# Patient Record
Sex: Female | Born: 1975 | Race: White | Hispanic: No
Health system: Southern US, Community
[De-identification: ages and names within clinical notes are randomized; demographics above are authoritative.]

---

## 1998-03-05 ENCOUNTER — Other Ambulatory Visit: Admission: RE | Admit: 1998-03-05 | Discharge: 1998-03-05 | Payer: Self-pay | Admitting: Obstetrics and Gynecology

## 1998-03-20 ENCOUNTER — Inpatient Hospital Stay (HOSPITAL_COMMUNITY): Admission: AD | Admit: 1998-03-20 | Discharge: 1998-03-20 | Payer: Self-pay | Admitting: Obstetrics & Gynecology

## 1998-06-09 ENCOUNTER — Inpatient Hospital Stay (HOSPITAL_COMMUNITY): Admission: AD | Admit: 1998-06-09 | Discharge: 1998-06-09 | Payer: Self-pay | Admitting: Obstetrics and Gynecology

## 1998-06-10 ENCOUNTER — Inpatient Hospital Stay (HOSPITAL_COMMUNITY): Admission: AD | Admit: 1998-06-10 | Discharge: 1998-06-13 | Payer: Self-pay | Admitting: Obstetrics and Gynecology

## 1999-12-06 ENCOUNTER — Emergency Department (HOSPITAL_COMMUNITY): Admission: EM | Admit: 1999-12-06 | Discharge: 1999-12-06 | Payer: Self-pay | Admitting: Emergency Medicine

## 2000-03-08 ENCOUNTER — Inpatient Hospital Stay (HOSPITAL_COMMUNITY): Admission: AD | Admit: 2000-03-08 | Discharge: 2000-03-08 | Payer: Self-pay | Admitting: Obstetrics and Gynecology

## 2000-06-06 ENCOUNTER — Inpatient Hospital Stay (HOSPITAL_COMMUNITY): Admission: AD | Admit: 2000-06-06 | Discharge: 2000-06-06 | Payer: Self-pay | Admitting: Obstetrics and Gynecology

## 2000-07-21 ENCOUNTER — Ambulatory Visit (HOSPITAL_COMMUNITY): Admission: AD | Admit: 2000-07-21 | Discharge: 2000-07-21 | Payer: Self-pay | Admitting: Obstetrics and Gynecology

## 2000-07-28 ENCOUNTER — Encounter: Payer: Self-pay | Admitting: Obstetrics and Gynecology

## 2000-07-28 ENCOUNTER — Inpatient Hospital Stay (HOSPITAL_COMMUNITY): Admission: AD | Admit: 2000-07-28 | Discharge: 2000-07-28 | Payer: Self-pay | Admitting: Obstetrics and Gynecology

## 2000-08-11 ENCOUNTER — Encounter: Payer: Self-pay | Admitting: Obstetrics and Gynecology

## 2000-08-11 ENCOUNTER — Ambulatory Visit (HOSPITAL_COMMUNITY): Admission: RE | Admit: 2000-08-11 | Discharge: 2000-08-11 | Payer: Self-pay | Admitting: Obstetrics and Gynecology

## 2000-10-11 ENCOUNTER — Inpatient Hospital Stay (HOSPITAL_COMMUNITY): Admission: AD | Admit: 2000-10-11 | Discharge: 2000-10-12 | Payer: Self-pay | Admitting: Obstetrics and Gynecology

## 2002-05-23 ENCOUNTER — Other Ambulatory Visit: Admission: RE | Admit: 2002-05-23 | Discharge: 2002-05-23 | Payer: Self-pay | Admitting: Obstetrics and Gynecology

## 2002-09-11 ENCOUNTER — Encounter: Payer: Self-pay | Admitting: Obstetrics and Gynecology

## 2002-09-11 ENCOUNTER — Ambulatory Visit (HOSPITAL_COMMUNITY): Admission: RE | Admit: 2002-09-11 | Discharge: 2002-09-11 | Payer: Self-pay | Admitting: Obstetrics and Gynecology

## 2002-11-08 ENCOUNTER — Ambulatory Visit (HOSPITAL_COMMUNITY): Admission: RE | Admit: 2002-11-08 | Discharge: 2002-11-08 | Payer: Self-pay | Admitting: Obstetrics and Gynecology

## 2002-12-14 ENCOUNTER — Inpatient Hospital Stay (HOSPITAL_COMMUNITY): Admission: AD | Admit: 2002-12-14 | Discharge: 2002-12-14 | Payer: Self-pay | Admitting: Obstetrics and Gynecology

## 2003-02-09 ENCOUNTER — Inpatient Hospital Stay (HOSPITAL_COMMUNITY): Admission: AD | Admit: 2003-02-09 | Discharge: 2003-02-09 | Payer: Self-pay | Admitting: Obstetrics and Gynecology

## 2003-02-09 ENCOUNTER — Inpatient Hospital Stay (HOSPITAL_COMMUNITY): Admission: AD | Admit: 2003-02-09 | Discharge: 2003-02-11 | Payer: Self-pay | Admitting: Obstetrics and Gynecology

## 2003-02-10 ENCOUNTER — Encounter (INDEPENDENT_AMBULATORY_CARE_PROVIDER_SITE_OTHER): Payer: Self-pay

## 2003-03-06 ENCOUNTER — Emergency Department (HOSPITAL_COMMUNITY): Admission: EM | Admit: 2003-03-06 | Discharge: 2003-03-06 | Payer: Self-pay | Admitting: Emergency Medicine

## 2003-03-06 ENCOUNTER — Encounter: Payer: Self-pay | Admitting: Emergency Medicine

## 2003-03-26 ENCOUNTER — Other Ambulatory Visit: Admission: RE | Admit: 2003-03-26 | Discharge: 2003-03-26 | Payer: Self-pay | Admitting: Obstetrics and Gynecology

## 2003-12-28 ENCOUNTER — Emergency Department (HOSPITAL_COMMUNITY): Admission: EM | Admit: 2003-12-28 | Discharge: 2003-12-28 | Payer: Self-pay | Admitting: Emergency Medicine

## 2004-12-17 ENCOUNTER — Other Ambulatory Visit: Admission: RE | Admit: 2004-12-17 | Discharge: 2004-12-17 | Payer: Self-pay | Admitting: Obstetrics and Gynecology

## 2005-04-20 ENCOUNTER — Other Ambulatory Visit: Admission: RE | Admit: 2005-04-20 | Discharge: 2005-04-20 | Payer: Self-pay | Admitting: Gynecology

## 2005-11-05 ENCOUNTER — Other Ambulatory Visit: Admission: RE | Admit: 2005-11-05 | Discharge: 2005-11-05 | Payer: Self-pay | Admitting: Obstetrics and Gynecology

## 2005-12-07 ENCOUNTER — Inpatient Hospital Stay (HOSPITAL_COMMUNITY): Admission: AD | Admit: 2005-12-07 | Discharge: 2005-12-07 | Payer: Self-pay | Admitting: Obstetrics and Gynecology

## 2006-03-02 ENCOUNTER — Inpatient Hospital Stay (HOSPITAL_COMMUNITY): Admission: AD | Admit: 2006-03-02 | Discharge: 2006-03-03 | Payer: Self-pay | Admitting: Obstetrics and Gynecology

## 2006-05-19 ENCOUNTER — Other Ambulatory Visit: Admission: RE | Admit: 2006-05-19 | Discharge: 2006-05-19 | Payer: Self-pay | Admitting: Obstetrics and Gynecology

## 2007-04-05 ENCOUNTER — Other Ambulatory Visit: Admission: RE | Admit: 2007-04-05 | Discharge: 2007-04-05 | Payer: Self-pay | Admitting: Gynecology

## 2007-12-05 ENCOUNTER — Encounter: Admission: RE | Admit: 2007-12-05 | Discharge: 2007-12-05 | Payer: Self-pay | Admitting: Family Medicine

## 2008-12-08 ENCOUNTER — Inpatient Hospital Stay (HOSPITAL_COMMUNITY): Admission: AD | Admit: 2008-12-08 | Discharge: 2008-12-09 | Payer: Self-pay | Admitting: Obstetrics and Gynecology

## 2010-12-25 LAB — CBC
HCT: 39.6 % (ref 36.0–46.0)
Hemoglobin: 13.4 g/dL (ref 12.0–15.0)
MCHC: 33.9 g/dL (ref 30.0–36.0)
MCHC: 34 g/dL (ref 30.0–36.0)
MCV: 94.4 fL (ref 78.0–100.0)
Platelets: 196 10*3/uL (ref 150–400)
RBC: 4.19 MIL/uL (ref 3.87–5.11)
RDW: 13.5 % (ref 11.5–15.5)
RDW: 13.9 % (ref 11.5–15.5)
WBC: 12.1 10*3/uL — ABNORMAL HIGH (ref 4.0–10.5)

## 2011-01-30 NOTE — Discharge Summary (Signed)
[name], [name]                  ACCOUNT NO.:  [id number]   MEDICAL RECORD NO.:  [id number]          PATIENT TYPE:  INP   LOCATION:  9109                          FACILITY:  [facility]   PHYSICIAN:  Zenaida Niece, M.D.DATE OF BIRTH:  06-Apr-1976   DATE OF ADMISSION:  03/02/2006  DATE OF DISCHARGE:  03/03/2006                                 DISCHARGE SUMMARY   ADMISSION DIAGNOSES:  1.  Intrauterine pregnancy at 41 weeks.  2.  Group B Strep carrier.  3.  History of fetal macrosomia.   DISCHARGE DIAGNOSES:  1.  Intrauterine pregnancy at 41 weeks.  2.  Group B Strep carrier.  3.  Fetal macrosomia.   PROCEDURE:  On March 02, 2006 she had a spontaneous vaginal delivery.   HISTORY OF PRESENT ILLNESS:  This is a 35 year old white female gravida 4,  para 3-0-0-3 with an EGA of 41+ weeks who presented with complaint of  regular contractions.  Initial evaluation in triage revealed her cervix to  be 4 to 5 cm dilated.  Prenatal care complicated by group B Strep in her  urine initially treated with Penicillin, upper respiratory tract infection  at 20 weeks treated with Augmented followed by Zithromax x2 and she was then  seen by an ENT and treated for sinusitis.  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   PRENATAL LABORATORY DATA:  Blood type is O negative with negative antibody  screen.  RPR nonreactive. Rubella immune.  Hepatitis B surface antigen  negative.  Gonorrhea and Chlamydia negative.  Triple screen normal.  One  hour Glucola 138.  Three hour GTT 76, 116, 83 and 52.  Group B Strep  positive.   OBSTETRICAL HISTORY:  Three vaginal deliveries at term weighing 11 pounds, 9  ounces, 9 pounds, 7 ounces, 8 pounds, 14 ounces and she has had no  complications.  The remainder of her history is noncontributory.   PHYSICAL EXAMINATION:  VITAL SIGNS:  She is afebrile stable vital signs.  Fetal heart tracing was reactive with contractions every 4 minutes.  ABDOMEN:  Gravid, nontender with estimated  fetal weight of 9 pounds.  PELVIC:  Cervix, most recent examination per the nurse is 5, 90, minus 1 and  vertex.   HOSPITAL COURSE:  The patient was admitted and continued to contract on her  own.  She had spontaneous rupture of membranes.  She progressed to complete,  pushed well and on the morning of March 02, 2006 had a vaginal delivery of a  viable female infant with Apgar's of 9 and 9 and weighed 9 pounds, 11 ounces.  Placenta delivered spontaneously, was intact and was sent for cord blood  collection.  She had a small perineal laceration repaired with 3-0 Vicryl  with local block for hemostasis. Estimated blood loss was 600 cc.  Postpartum she had no significant complications and on postpartum day #1  requested discharge home.  She was felt to be stable for discharge.  Predelivery hemoglobin 13.3, post delivery 11.1.   DISCHARGE INSTRUCTIONS:  Regular diet, pelvic rest, followup in six weeks.  She  is given our discharge pamphlet.   DISCHARGE MEDICATIONS:  Over the counter ibuprofen as needed.      Zenaida Niece, M.D.  Electronically Signed     TDM/MEDQ  D:  03/03/2006  T:  03/03/2006  Job:  [id number]

## 2011-04-07 ENCOUNTER — Other Ambulatory Visit (HOSPITAL_COMMUNITY)
Admission: RE | Admit: 2011-04-07 | Discharge: 2011-04-07 | Disposition: A | Discharge status: Home / Self Care | Payer: BC Managed Care – PPO | Source: Ambulatory Visit | Attending: Family Medicine | Admitting: Family Medicine

## 2011-04-07 ENCOUNTER — Other Ambulatory Visit: Payer: Self-pay | Admitting: Family Medicine

## 2011-04-07 DIAGNOSIS — Z1159 Encounter for screening for other viral diseases: Secondary | ICD-10-CM | POA: Insufficient documentation

## 2011-04-07 DIAGNOSIS — Z124 Encounter for screening for malignant neoplasm of cervix: Secondary | ICD-10-CM | POA: Insufficient documentation

## 2012-02-01 ENCOUNTER — Other Ambulatory Visit: Payer: Self-pay | Admitting: Dermatology

## 2012-04-18 ENCOUNTER — Ambulatory Visit (INDEPENDENT_AMBULATORY_CARE_PROVIDER_SITE_OTHER): Payer: BC Managed Care – PPO | Admitting: Surgery

## 2013-03-22 ENCOUNTER — Other Ambulatory Visit (HOSPITAL_COMMUNITY)
Admission: RE | Admit: 2013-03-22 | Discharge: 2013-03-22 | Disposition: A | Discharge status: Home / Self Care | Payer: Managed Care, Other (non HMO) | Source: Ambulatory Visit | Attending: Family Medicine | Admitting: Family Medicine

## 2013-03-22 ENCOUNTER — Other Ambulatory Visit: Payer: Self-pay | Admitting: Family Medicine

## 2013-03-22 DIAGNOSIS — Z1151 Encounter for screening for human papillomavirus (HPV): Secondary | ICD-10-CM | POA: Insufficient documentation

## 2013-03-22 DIAGNOSIS — R87810 Cervical high risk human papillomavirus (HPV) DNA test positive: Secondary | ICD-10-CM | POA: Insufficient documentation

## 2013-03-22 DIAGNOSIS — Z124 Encounter for screening for malignant neoplasm of cervix: Secondary | ICD-10-CM | POA: Insufficient documentation

## 2013-10-04 ENCOUNTER — Other Ambulatory Visit: Payer: Self-pay | Admitting: Family Medicine

## 2013-10-04 DIAGNOSIS — N6310 Unspecified lump in the right breast, unspecified quadrant: Secondary | ICD-10-CM

## 2013-10-12 ENCOUNTER — Ambulatory Visit
Admission: RE | Admit: 2013-10-12 | Discharge: 2013-10-12 | Disposition: A | Discharge status: Home / Self Care | Payer: Managed Care, Other (non HMO) | Source: Ambulatory Visit | Attending: Family Medicine | Admitting: Family Medicine

## 2013-10-12 DIAGNOSIS — N6310 Unspecified lump in the right breast, unspecified quadrant: Secondary | ICD-10-CM

## 2014-09-14 IMAGING — MG MM DIAGNOSTIC BILATERAL
5 series · 5 of 5 positions shown · non-contrast
Comparison: None.

CLINICAL DATA: 37-year-old female with a palpable abnormalities in
the right breast. The patient states she feels pain in this
location, and her clinician felt a palpable abnormality.

EXAM:
DIGITAL DIAGNOSTIC  BILATERAL MAMMOGRAM WITH CAD
ULTRASOUND RIGHT BREAST

[R CC]
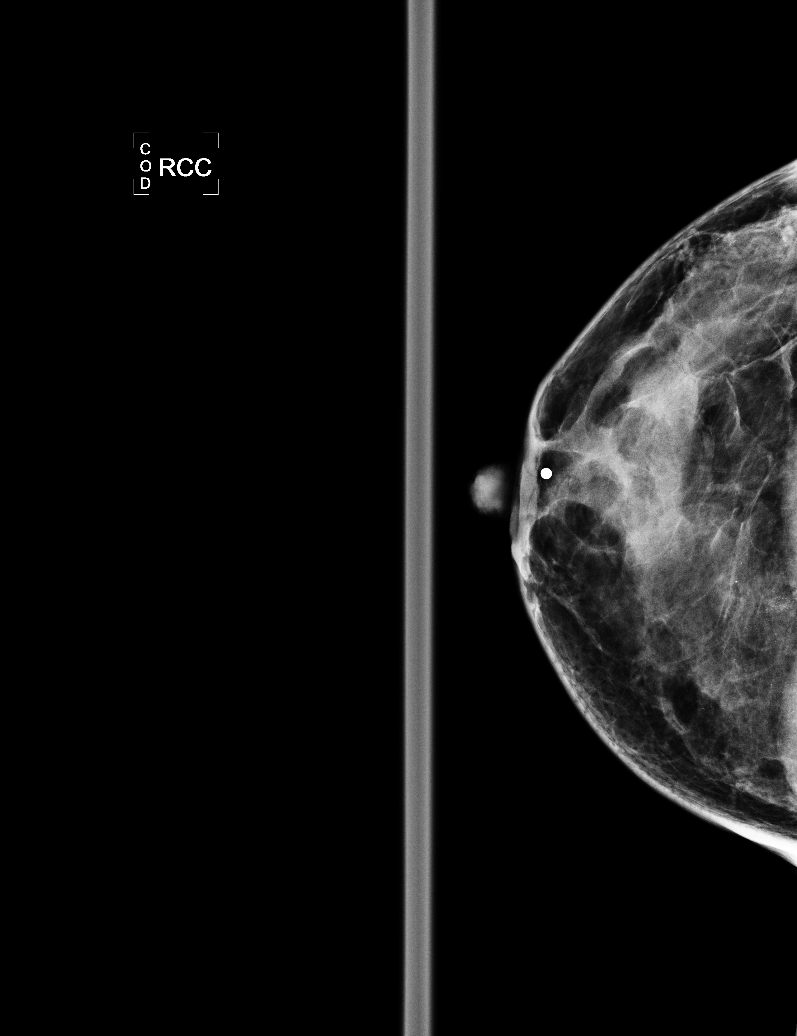

[L CC]
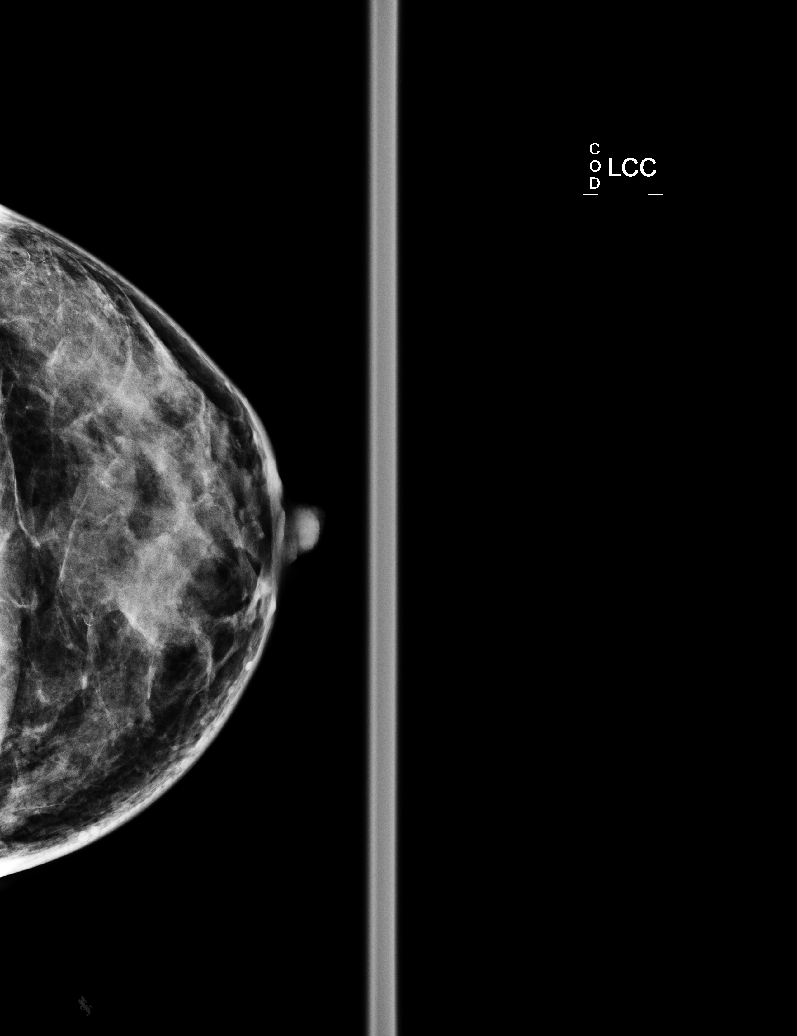

[L MLO]
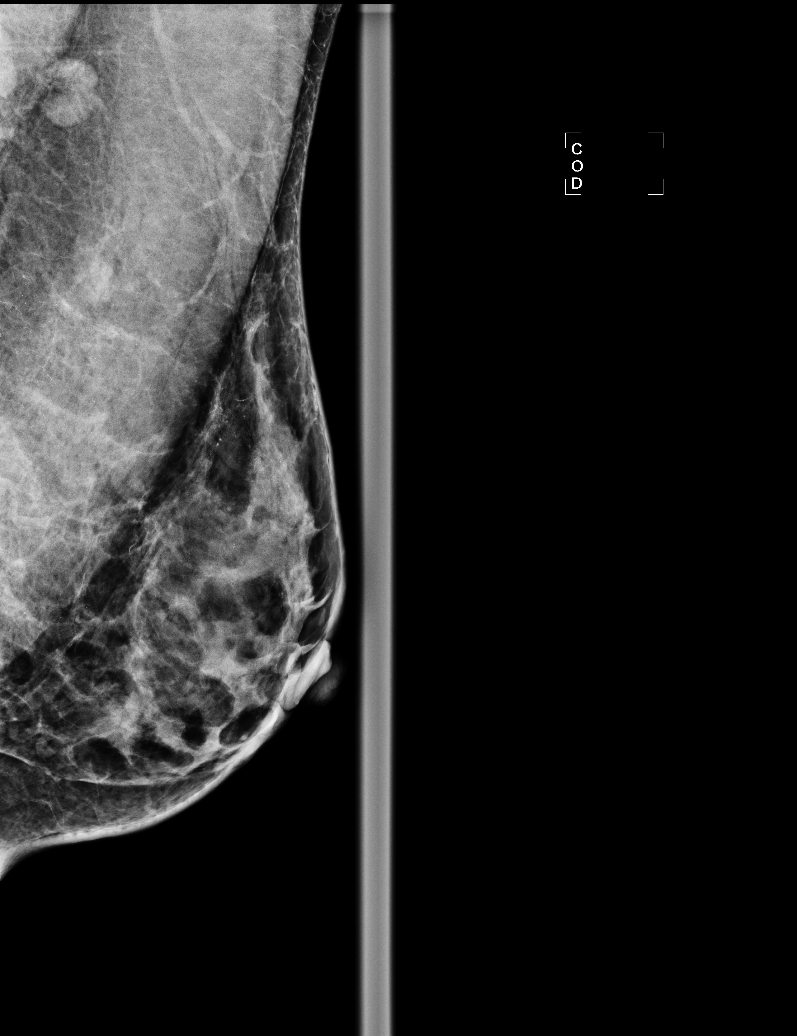

[R MLO]
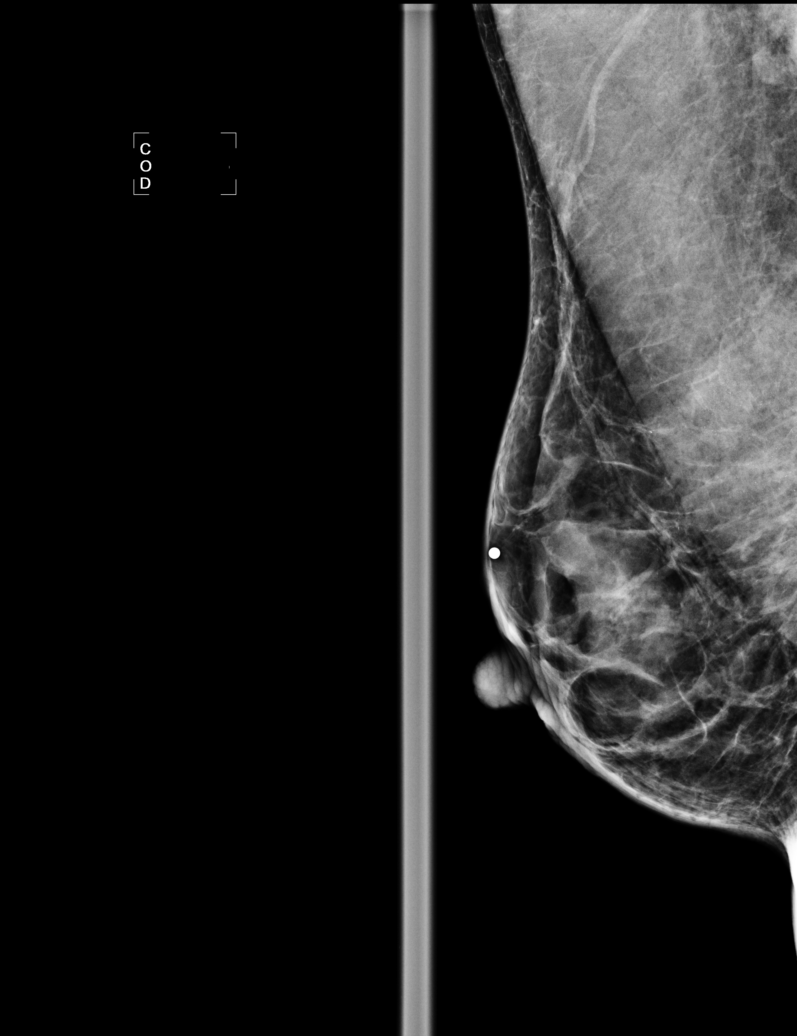

[R TAN]
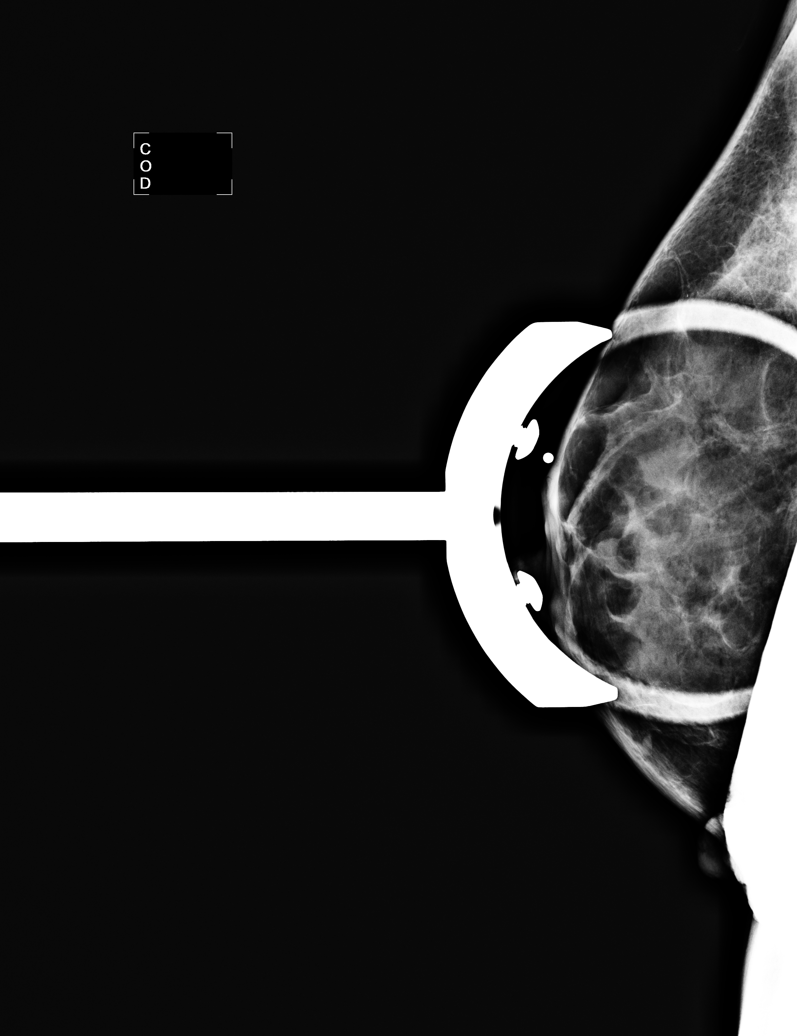

[5 of 5 positions shown; findings below may reference images not displayed]

ACR Breast Density Category c: The breast tissue is heterogeneously
dense, which may obscure small masses.
FINDINGS: No suspicious masses or calcifications are seen in either breast.
Note that the patient reports she has deodorant on, which results in
artifact on the mammographic images. A spot compression tangential
view over the palpable site of concern in the right breast was
performed with no mammographic abnormality seen in this location.

Mammographic images were processed with CAD.

Physical examination at site of palpable concern in the right breast
demonstrates a prominent palpable [REDACTED]'s tubercle. No
underlying masses are felt within the breast.

Targeted ultrasound of the right breast was performed. No discrete
masses or abnormalities are seen, only normal appearing
fibroglandular tissue is visualized.
IMPRESSION: 1. No mammographic or sonographic correlate for the right breast
palpable abnormality; this may be related to a prominent [REDACTED]s
tubercle location.

2.  No mammographic evidence of malignancy in either breast.

3.  Artifact from deodorant seen bilaterally.

RECOMMENDATION:
1. Recommend further decisions/management of the right breast
palpable abnormality be based on clinical grounds.

2. Screening mammogram at age 42 unless there are persistent or
intervening clinical concerns. (Code:OB-[id number])

I have discussed the findings and recommendations with the patient.
Results were also provided in writing at the conclusion of the
visit.

BI-RADS CATEGORY  2: Benign finding(s).
